# Patient Record
Sex: Female | Born: 1965 | Race: White | Hispanic: No | Marital: Married | Smoking: Never smoker
Health system: Southern US, Community
[De-identification: ages and names within clinical notes are randomized; demographics above are authoritative.]

## PROBLEM LIST (undated history)

## (undated) DIAGNOSIS — R011 Cardiac murmur, unspecified: Secondary | ICD-10-CM

## (undated) HISTORY — PX: COLONOSCOPY: SHX174

## (undated) HISTORY — DX: Cardiac murmur, unspecified: R01.1

## (undated) HISTORY — PX: LAPAROSCOPY: SHX197

## (undated) HISTORY — PX: ENDOMETRIAL ABLATION: SHX621

---

## 2000-03-09 DIAGNOSIS — D229 Melanocytic nevi, unspecified: Secondary | ICD-10-CM

## 2000-03-09 HISTORY — DX: Melanocytic nevi, unspecified: D22.9

## 2008-01-16 ENCOUNTER — Ambulatory Visit: Payer: Self-pay | Admitting: Internal Medicine

## 2008-02-01 ENCOUNTER — Ambulatory Visit: Payer: Self-pay | Admitting: Internal Medicine

## 2010-04-07 ENCOUNTER — Encounter: Admission: RE | Admit: 2010-04-07 | Discharge: 2010-04-07 | Payer: Self-pay | Admitting: Obstetrics and Gynecology

## 2014-04-10 ENCOUNTER — Encounter: Payer: Self-pay | Admitting: Internal Medicine

## 2018-02-08 ENCOUNTER — Encounter: Payer: Self-pay | Admitting: Internal Medicine

## 2018-03-12 ENCOUNTER — Encounter: Payer: Self-pay | Admitting: Internal Medicine

## 2018-04-10 ENCOUNTER — Encounter: Payer: Self-pay | Admitting: Internal Medicine

## 2018-04-10 ENCOUNTER — Ambulatory Visit (AMBULATORY_SURGERY_CENTER): Payer: Self-pay | Admitting: *Deleted

## 2018-04-10 ENCOUNTER — Other Ambulatory Visit: Payer: Self-pay

## 2018-04-10 VITALS — Ht 64.0 in | Wt 124.4 lb

## 2018-04-10 DIAGNOSIS — Z1211 Encounter for screening for malignant neoplasm of colon: Secondary | ICD-10-CM

## 2018-04-10 DIAGNOSIS — Z800 Family history of malignant neoplasm of digestive organs: Secondary | ICD-10-CM

## 2018-04-10 MED ORDER — SUPREP BOWEL PREP KIT 17.5-3.13-1.6 GM/177ML PO SOLN: 1.0000 | Freq: Once | ORAL | 0 refills | Status: AC

## 2018-04-10 NOTE — Progress Notes (Signed)
No egg or soy allergy known to patient  No issues with past sedation with any surgeries  or procedures, no intubation problems  No diet pills per patient No home 02 use per patient  No blood thinners per patient  Pt denies issues with constipation  No A fib or A flutter  EMMI video sent to pt's e mail  Suprep 15 dollar coupon given

## 2018-04-11 ENCOUNTER — Telehealth: Payer: Self-pay | Admitting: Internal Medicine

## 2018-04-11 DIAGNOSIS — Z1211 Encounter for screening for malignant neoplasm of colon: Secondary | ICD-10-CM

## 2018-04-11 MED ORDER — NA SULFATE-K SULFATE-MG SULF 17.5-3.13-1.6 GM/177ML PO SOLN: 1.0000 | Freq: Once | ORAL | 0 refills | Status: AC

## 2018-04-11 NOTE — Telephone Encounter (Signed)
Sent prep to [employer] [facility]

## 2018-04-24 ENCOUNTER — Encounter: Payer: Self-pay | Admitting: Internal Medicine

## 2018-04-24 ENCOUNTER — Ambulatory Visit (AMBULATORY_SURGERY_CENTER): Payer: PRIVATE HEALTH INSURANCE | Admitting: Internal Medicine

## 2018-04-24 VITALS — BP 118/70 | HR 67 | Temp 98.6°F | Resp 14 | Ht 64.0 in | Wt 124.0 lb

## 2018-04-24 DIAGNOSIS — Z1211 Encounter for screening for malignant neoplasm of colon: Secondary | ICD-10-CM

## 2018-04-24 MED ORDER — SODIUM CHLORIDE 0.9 % IV SOLN
500.0000 mL | Freq: Once | INTRAVENOUS | Status: DC
Start: 2018-04-24 — End: 2019-10-09

## 2018-04-24 NOTE — Op Note (Signed)
[place] Patient Name: Latoya Colon Procedure Date: 04/24/2018 2:00 PM MRN: [id number] Endoscopist: Docia Chuck. Henrene Pastor , MD Age: 52 Referring MD:  Date of Birth: 04/07/1966 Gender: Female Account #: [id number] Procedure:                Colonoscopy Indications:              Screening in patient at increased risk: Colorectal                            cancer in father 100 or older (89). Negative index                            exam 2009 Medicines:                Monitored Anesthesia Care Procedure:                Pre-Anesthesia Assessment:                           - Prior to the procedure, a History and Physical                            was performed, and patient medications and                            allergies were reviewed. The patient's tolerance of                            previous anesthesia was also reviewed. The risks                            and benefits of the procedure and the sedation                            options and risks were discussed with the patient.                            All questions were answered, and informed consent                            was obtained. Prior Anticoagulants: The patient has                            taken no previous anticoagulant or antiplatelet                            agents. ASA Grade Assessment: I - A normal, healthy                            patient. After reviewing the risks and benefits,                            the patient was deemed in satisfactory condition to  undergo the procedure.                           After obtaining informed consent, the colonoscope                            was passed under direct vision. Throughout the                            procedure, the patient's blood pressure, pulse, and                            oxygen saturations were monitored continuously. The                            Model GIF-HQ190 [id number]) scope was introduced                     through the anus and advanced to the the cecum,                            identified by appendiceal orifice and ileocecal                            valve. The ileocecal valve, appendiceal orifice,                            and rectum were photographed. The quality of the                            bowel preparation was good. The colonoscopy was                            performed without difficulty. The patient tolerated                            the procedure well. The bowel preparation used was                            SUPREP. Scope In: 2:06:46 PM Scope Out: 2:41:06 PM Scope Withdrawal Time: 0 hours 9 minutes 15 seconds  Total Procedure Duration: 0 hours 34 minutes 20 seconds  Findings:                 The entire examined colon appeared normal on direct                            and retroflexion views. Complications:            No immediate complications. Estimated blood loss:                            None. Estimated Blood Loss:     Estimated blood loss: none. Impression:               - The entire examined colon is normal on direct and  retroflexion views.                           - NOTE: The rectosigmoid reason was fixed/narrow                            not permitting easy passage of either the adult                            colonoscope or the pediatric colonoscope. The                            examination was achieved with the standard upper                            endoscope. Recommendation:           - Repeat colonoscopy in 10 years for screening                            purposes (STANDARD UPPER ENDOSCOPE LIKELY NEEDED).                           - Patient has a contact number available for                            emergencies. The signs and symptoms of potential                            delayed complications were discussed with the                            patient. Return to normal activities tomorrow.                             Written discharge instructions were provided to the                            patient.                           - Resume previous diet.                           - Continue present medications. Docia Chuck. Henrene Pastor, MD 04/24/2018 2:54:59 PM This report has been signed electronically.

## 2018-04-24 NOTE — Progress Notes (Signed)
PT taken to PACU. Monitors in place. VSS. Report given to RN. 

## 2018-04-24 NOTE — Patient Instructions (Signed)
YOU HAD AN ENDOSCOPIC PROCEDURE TODAY AT [facility]:   Refer to the procedure report that was given to you for any specific questions about what was found during the examination.  If the procedure report does not answer your questions, please call your gastroenterologist to clarify.  If you requested that your care partner not be given the details of your procedure findings, then the procedure report has been included in a sealed envelope for you to review at your convenience later.  YOU SHOULD EXPECT: Some feelings of bloating in the abdomen. Passage of more gas than usual.  Walking can help get rid of the air that was put into your GI tract during the procedure and reduce the bloating. If you had a lower endoscopy (such as a colonoscopy or flexible sigmoidoscopy) you may notice spotting of blood in your stool or on the toilet paper. If you underwent a bowel prep for your procedure, you may not have a normal bowel movement for a few days.  Please Note:  You might notice some irritation and congestion in your nose or some drainage.  This is from the oxygen used during your procedure.  There is no need for concern and it should clear up in a day or so.  SYMPTOMS TO REPORT IMMEDIATELY:   Following lower endoscopy (colonoscopy or flexible sigmoidoscopy):  Excessive amounts of blood in the stool  Significant tenderness or worsening of abdominal pains  Swelling of the abdomen that is new, acute  Fever of 100F or higher  For urgent or emergent issues, a gastroenterologist can be reached at any hour by calling [phone].   DIET:  We do recommend a small meal at first, but then you may proceed to your regular diet.  Drink plenty of fluids but you should avoid alcoholic beverages for 24 hours.  MEDICATION: Continue present medications.  ACTIVITY:  You should plan to take it easy for the rest of today and you should NOT DRIVE or use heavy machinery until tomorrow (because of the  sedation medicines used during the test).    FOLLOW UP: Our staff will call the number listed on your records the next business day following your procedure to check on you and address any questions or concerns that you may have regarding the information given to you following your procedure. If we do not reach you, we will leave a message.  However, if you are feeling well and you are not experiencing any problems, there is no need to return our call.  We will assume that you have returned to your regular daily activities without incident.  If any biopsies were taken you will be contacted by phone or by letter within the next 1-3 weeks.  Please call us at [phone] if you have not heard about the biopsies in 3 weeks.   Thank you for allowing [place] to provide for your healthcare needs today.   SIGNATURES/CONFIDENTIALITY: You and/or your care partner have signed paperwork which will be entered into your electronic medical record.  These signatures attest to the fact that that the information above on your After Visit Summary has been reviewed and is understood.  Full responsibility of the confidentiality of this discharge information lies with you and/or your care-partner.

## 2018-04-24 NOTE — Progress Notes (Signed)
Pt's states no medical or surgical changes since previsit or office visit. 

## 2018-04-25 ENCOUNTER — Telehealth: Payer: Self-pay

## 2018-04-25 ENCOUNTER — Telehealth: Payer: Self-pay | Admitting: *Deleted

## 2018-04-25 NOTE — Telephone Encounter (Signed)
  Follow up Call-  Call back number 04/24/2018  Post procedure Call Back phone  # [phone]  Permission to leave phone message Yes  Some recent data might be hidden     Patient questions:  Do you have a fever, pain , or abdominal swelling? No. Pain Score  0 *  Have you tolerated food without any problems? Yes.    Have you been able to return to your normal activities? Yes.    Do you have any questions about your discharge instructions: Diet   No. Medications  No. Follow up visit  No.  Do you have questions or concerns about your Care? No.  Actions: * If pain score is 4 or above: No action needed, pain <4.

## 2018-04-25 NOTE — Telephone Encounter (Signed)
No answer, left message to call if questions or concerns. 

## 2019-10-09 ENCOUNTER — Ambulatory Visit (INDEPENDENT_AMBULATORY_CARE_PROVIDER_SITE_OTHER): Payer: PRIVATE HEALTH INSURANCE | Admitting: Dermatology

## 2019-10-09 ENCOUNTER — Encounter: Payer: Self-pay | Admitting: Dermatology

## 2019-10-09 ENCOUNTER — Other Ambulatory Visit: Payer: Self-pay

## 2019-10-09 ENCOUNTER — Encounter (INDEPENDENT_AMBULATORY_CARE_PROVIDER_SITE_OTHER): Payer: Self-pay

## 2019-10-09 ENCOUNTER — Telehealth: Payer: Self-pay | Admitting: *Deleted

## 2019-10-09 DIAGNOSIS — L719 Rosacea, unspecified: Secondary | ICD-10-CM

## 2019-10-09 MED ORDER — SOOLANTRA 1 % EX CREA: 30.0000 | TOPICAL_CREAM | Freq: Every morning | CUTANEOUS | 2 refills | Status: DC

## 2019-10-09 MED ORDER — METRONIDAZOLE 1 % EX GEL: Freq: Every day | CUTANEOUS | 3 refills | Status: DC

## 2019-10-09 NOTE — Telephone Encounter (Signed)
Latoya Colon (Latoya Colon)  Your information has been sent to [facility].  Prior authorization done via cover my meds for Rx. Metronidazole 1% gel

## 2019-10-11 ENCOUNTER — Telehealth: Payer: Self-pay | Admitting: Dermatology

## 2019-10-11 MED ORDER — SOOLANTRA 1 % EX CREA: 1.0000 "application " | TOPICAL_CREAM | Freq: Every day | CUTANEOUS | 2 refills | Status: DC

## 2019-10-11 NOTE — Telephone Encounter (Addendum)
Patient calling to give follow up information on last visit.  Got Soolantra  Prescription for a copayment of $75.00. Having trouble getting prescription through Krist[facility] Oppenheim at [facility].  Patient instructs [place] to use [facility] in [place]. Phone number is [phone].  Chart # [id number].

## 2019-10-13 ENCOUNTER — Encounter: Payer: Self-pay | Admitting: Dermatology

## 2019-10-13 NOTE — Progress Notes (Signed)
   Follow-Up Visit   Subjective  Latoya Colon is a 54 y.o. female who presents for the following: Skin Problem (Patient here today for break out on left cheek and neck x months.  Patient states that she's used [place] did help some but the medication was expired.  Now patients face is flared up and red.).  rosacea Location: Face and neck Duration: Years Quality: Waxes and wanes Associated Signs/Symptoms: Red blotches Modifying Factors: Topical metronidazole and cilantro help Severity:  Timing: Context: Covid mask may worsen  The following portions of the chart were reviewed this encounter and updated as appropriate:     Objective  Well appearing patient in no apparent distress; mood and affect are within normal limits.  A focused examination was performed including head and neck examined. Relevant physical exam findings are noted in the Assessment and Plan.   Assessment & Plan  Rosacea (5) Head - Anterior (Face) (2); Left Eye; Neck - Anterior (2)  Patient expresses opinion that both Soolantra and topical metronidazole are helpful together. I'm okay with her alternating these topicals nightly, taper if doing well (? Post-COVID).

## 2019-10-24 ENCOUNTER — Other Ambulatory Visit: Payer: Self-pay

## 2019-10-24 MED ORDER — METRONIDAZOLE 1 % EX GEL: Freq: Every day | CUTANEOUS | 3 refills | Status: DC

## 2019-11-19 ENCOUNTER — Encounter: Payer: Self-pay | Admitting: *Deleted

## 2019-11-20 ENCOUNTER — Other Ambulatory Visit: Payer: Self-pay

## 2019-11-20 ENCOUNTER — Encounter: Payer: Self-pay | Admitting: Dermatology

## 2019-11-20 ENCOUNTER — Ambulatory Visit (INDEPENDENT_AMBULATORY_CARE_PROVIDER_SITE_OTHER): Payer: PRIVATE HEALTH INSURANCE | Admitting: Dermatology

## 2019-11-20 DIAGNOSIS — Z86018 Personal history of other benign neoplasm: Secondary | ICD-10-CM | POA: Diagnosis not present

## 2019-11-20 DIAGNOSIS — D225 Melanocytic nevi of trunk: Secondary | ICD-10-CM

## 2019-11-20 DIAGNOSIS — L821 Other seborrheic keratosis: Secondary | ICD-10-CM

## 2019-11-20 DIAGNOSIS — L309 Dermatitis, unspecified: Secondary | ICD-10-CM | POA: Diagnosis not present

## 2019-11-20 DIAGNOSIS — D229 Melanocytic nevi, unspecified: Secondary | ICD-10-CM

## 2019-11-20 NOTE — Progress Notes (Signed)
   Follow-Up Visit   Subjective  Latoya Colon is a 54 y.o. female who presents for the following: Annual Exam (no concerns).  Moles Location: All over Duration: Able Quality:  Associated Signs/Symptoms: Modifying Factors:  Severity:  Timing: Context: 3 of atypical moles  The following portions of the chart were reviewed this encounter and updated as appropriate: Tobacco  Allergies  Meds  Problems  Med Hx  Surg Hx  Fam Hx      Objective  Well appearing patient in no apparent distress; mood and affect are within normal limits.  A full examination was performed including scalp, head, eyes, ears, nose, lips, neck, chest, axillae, abdomen, back, buttocks, bilateral upper extremities, bilateral lower extremities, hands, feet, fingers, toes, fingernails, and toenails. All findings within normal limits unless otherwise noted below.   Assessment & Plan  Dermatitis (2) Head - Anterior (Face); Lips  We will consider adding Clocosteroin when it becomes available  Nevus Mid Back  Annual skin check Had 2 issues that Barnetta Chapel and I discussed today.  The first is ongoing issue with breaking out on her mid to lateral cheeks. Rx topical may be helping but she feels that outdoor exposure with moderate sunlight is actually more beneficial.  When Clocosterone becomes available, I will phone this and for Curt Bears without a visit.  The second issue is a history of atypical moles.  Feet to scalp examined today and there are no recurrent nor new atypical moles.  She continues to get multiple benign keratoses, most numerous on the back plus subtle flesh-colored keratoses on the right front rib cage and on the sides.  Routine recheck in 1 year.

## 2020-01-22 ENCOUNTER — Encounter: Payer: Self-pay | Admitting: Dermatology

## 2020-01-24 MED ORDER — WINLEVI 1 % EX CREA: TOPICAL_CREAM | CUTANEOUS | 5 refills | Status: DC

## 2020-01-24 NOTE — Telephone Encounter (Signed)
Latoya Colon has been sent to pharmacy.

## 2020-01-27 ENCOUNTER — Telehealth: Payer: Self-pay

## 2020-01-27 MED ORDER — WINLEVI 1 % EX CREA: 1.0000 "application " | TOPICAL_CREAM | Freq: Every day | CUTANEOUS | 1 refills | Status: DC

## 2020-01-27 NOTE — Telephone Encounter (Signed)
Patient aware Winlevi 1% cream not covered, patient wanted the medication transferred to new [employer].

## 2020-03-18 ENCOUNTER — Telehealth: Payer: Self-pay

## 2020-03-18 NOTE — Telephone Encounter (Signed)
NOTES ON FILE FROM Louretta Shorten MD [phone], SENT REFERRAL TO [facility]

## 2020-07-13 ENCOUNTER — Encounter: Payer: Self-pay | Admitting: Internal Medicine

## 2020-07-13 ENCOUNTER — Telehealth (INDEPENDENT_AMBULATORY_CARE_PROVIDER_SITE_OTHER): Payer: PRIVATE HEALTH INSURANCE | Admitting: Internal Medicine

## 2020-07-13 VITALS — Wt 129.0 lb

## 2020-07-13 DIAGNOSIS — Z7189 Other specified counseling: Secondary | ICD-10-CM | POA: Diagnosis not present

## 2020-07-13 DIAGNOSIS — E7800 Pure hypercholesterolemia, unspecified: Secondary | ICD-10-CM | POA: Diagnosis not present

## 2020-07-13 NOTE — Patient Instructions (Signed)
Medication Instructions:  No Changes In Medications at this time.  *If you need a refill on your cardiac medications before your next appointment, please call your pharmacy*  Lab Work: None Ordered At This Time.  If you have labs (blood work) drawn today and your tests are completely normal, you will receive your results only by: Marland Kitchen MyChart Message (if you have MyChart) OR . A paper copy in the mail If you have any lab test that is abnormal or we need to change your treatment, we will call you to review the results.  Testing/Procedures: Dr. Debara Pickett has ordered a CT coronary calcium score. This test is done at [address]. This is $99 out of pocket.  Coronary CalciumScan A coronary calcium scan is an imaging test used to look for deposits of calcium and other fatty materials (plaques) in the inner lining of the blood vessels of the heart (coronary arteries). These deposits of calcium and plaques can partly clog and narrow the coronary arteries without producing any symptoms or warning signs. This puts a person at risk for a heart attack. This test can detect these deposits before symptoms develop. Tell a health care provider about:  Any allergies you have.  All medicines you are taking, including vitamins, herbs, eye drops, creams, and over-the-counter medicines.  Any problems you or family members have had with anesthetic medicines.  Any blood disorders you have.  Any surgeries you have had.  Any medical conditions you have.  Whether you are pregnant or may be pregnant. What are the risks? Generally, this is a safe procedure. However, problems may occur, including:  Harm to a pregnant woman and her unborn baby. This test involves the use of radiation. Radiation exposure can be dangerous to a pregnant woman and her unborn baby. If you are pregnant, you generally should not have this procedure done.  Slight increase in the risk of cancer. This is because of the  radiation involved in the test. What happens before the procedure? No preparation is needed for this procedure. What happens during the procedure?  You will undress and remove any jewelry around your neck or chest.  You will put on a hospital gown.  Sticky electrodes will be placed on your chest. The electrodes will be connected to an electrocardiogram (ECG) machine to record a tracing of the electrical activity of your heart.  A CT scanner will take pictures of your heart. During this time, you will be asked to lie still and hold your breath for 2-3 seconds while a picture of your heart is being taken. The procedure may vary among health care providers and hospitals. What happens after the procedure?  You can get dressed.  You can return to your normal activities.  It is up to you to get the results of your test. Ask your health care provider, or the department that is doing the test, when your results will be ready. Summary  A coronary calcium scan is an imaging test used to look for deposits of calcium and other fatty materials (plaques) in the inner lining of the blood vessels of the heart (coronary arteries).  Generally, this is a safe procedure. Tell your health care provider if you are pregnant or may be pregnant.  No preparation is needed for this procedure.  A CT scanner will take pictures of your heart.  You can return to your normal activities after the scan is done. This information is not intended to replace advice  given to you by your health care provider. Make sure you discuss any questions you have with your health care provider. Document Released: 12/17/2007 Document Revised: 05/09/2016 Document Reviewed: 05/09/2016 Elsevier Interactive Patient Education  2017 [place]: At [facility], you and your health needs are our priority.  As part of our continuing mission to provide you with exceptional heart care, we have created designated Provider  Care Teams.  These Care Teams include your primary Cardiologist (physician) and Advanced Practice Providers (APPs -  Physician Assistants and Nurse Practitioners) who all work together to provide you with the care you need, when you need it.  Your next appointment:   3-4 month(s) LIPID   The format for your next appointment:   In Person  Provider:   K. Mali Hilty, MD

## 2020-07-13 NOTE — Progress Notes (Signed)
Virtual Visit via Telephone Note   This visit type was conducted due to national recommendations for restrictions regarding the COVID-19 Pandemic (e.g. social distancing) in an effort to limit this patient's exposure and mitigate transmission in our community.  Due to her co-morbid illnesses, this patient is at least at moderate risk for complications without adequate follow up.  This format is felt to be most appropriate for this patient at this time.  The patient did not have access to video technology/had technical difficulties with video requiring transitioning to audio format only (telephone).  All issues noted in this document were discussed and addressed.  No physical exam could be performed with this format.  Please refer to the patient's chart for her  consent to telehealth for [facility].   Date:  07/13/2020   ID:  Latoya Colon, DOB 1966-01-17, MRN [id number] The patient was identified using 2 identifiers.  Evaluation Performed:  New Patient Evaluation  Patient Location:  [address] [place] [address]  Provider location:   [address], [place], [place]  [address]  PCP:  Louretta Shorten, MD  Cardiologist:  No primary care provider on file. Electrophysiologist:  None   Chief Complaint:  Manage dyslipidemia  History of Present Illness:    Latoya Colon is a 55 y.o. female who presents via audio/video conferencing for a telehealth visit today.  This is a pleasant 55 year old female kindly referred by Dr. Corinna Capra, with a longstanding history of dyslipidemia.  She reports her father had dyslipidemia but lived into his 10s with no coronary disease and died of colon cancer.  There is a lot of cancer in her family but no early onset heart disease.  She is known about high cholesterol for a number of years.  She has basically tried to manage it with diet and exercise.  She maintains a normal weight and exercises regularly and does yoga, but more recently she has  been less active due to COVID-19 pandemic and does report higher than normal intake of saturated fats including cheeses, butter and oils.  She does not necessarily eat a lot of fried food.  After seeing her PCP/GYN she had started on some red yeast rice but is not consistent with it.  She also takes over-the-counter vitamins and minerals but no prescription medications.  Although she does follow-up with Dr. Corinna Capra, she generally tries to avoid the health system.  She had lab work in September which showed a high total cholesterol of 272, triglycerides of 106, HDL 57, and LDL of 194.  Her LDL in March 2020 was 190.  These findings suggest probable familial hyperlipidemia however there could be a significant dietary component.  I would base this on her family history of very high cholesterol in her father.  We discussed the current guidelines which recommend lipid-lowering therapy on a statin with 50% reduction.  She is however doubtful that she has any significant coronary disease despite her high cholesterol and says she is generally not worried about it.  We then discussed the possibility of evaluation of coronary disease and particularly calcium scoring for risk prediction.  The patient does not have symptoms concerning for COVID-19 infection (fever, chills, cough, or new SHORTNESS OF BREATH).    Prior CV studies:   The following studies were reviewed today:  Lab work  PMHx:  Past Medical History:  Diagnosis Date  . Atypical mole 03/09/2000   Left Mid Scapula-Slight to Moderate(Dr. Dorothe Pea) (Exc)  . Atypical mole 11/15/2001  Right Scapula-Moderate (Dr. Shela Commons)  . Atypical mole 07/07/2003   Mid Upper Back-Moderate (Dr. Shela Commons)  . Heart murmur     Past Surgical History:  Procedure Laterality Date  . COLONOSCOPY    . ENDOMETRIAL ABLATION    . LAPAROSCOPY      FAMHx:  Family History  Problem Relation Age of Onset  . Colon cancer Father   . Colon polyps Sister   . Colon polyps Brother    . Stomach cancer Paternal Grandfather   . Esophageal cancer Neg Hx   . Rectal cancer Neg Hx     SOCHx:   reports that she has never smoked. She has never used smokeless tobacco. She reports current alcohol use of about 7.0 standard drinks of alcohol per week. She reports that she does not use drugs.  ALLERGIES:  No Known Allergies  MEDS:  Current Meds  Medication Sig  . Calcium Carb-Cholecalciferol 500-400 MG-UNIT CHEW Chew by mouth.  . Glucosamine-Chondroit-Vit C-Mn (GLUCOSAMINE CHONDR 1500 COMPLX PO) Take by mouth.  . Multiple Vitamins-Minerals (MULTIVITAL PO) Take by mouth.  . Red Yeast Rice 600 MG CAPS Take by mouth.  . Turmeric (QC TUMERIC COMPLEX) 500 MG CAPS Take by mouth.     ROS: Pertinent items noted in HPI and remainder of comprehensive ROS otherwise negative.  Labs/Other Tests and Data Reviewed:    Recent Labs: No results found for requested labs within last 8760 hours.   Recent Lipid Panel No results found for: CHOL, TRIG, HDL, CHOLHDL, LDLCALC, LDLDIRECT  Wt Readings from Last 3 Encounters:  07/13/20 129 lb (58.5 kg)  04/24/18 124 lb (56.2 kg)  04/10/18 124 lb 6.4 oz (56.4 kg)     Exam:    Vital Signs:  Wt 129 lb (58.5 kg)   HC 64" (162.6 cm)   BMI 22.14 kg/m    Exam not performed due to telephone visit  ASSESSMENT & PLAN:    1. 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 Family history of high cholesterol in her father, no early onset heart disease  Ms. Heagle has a mixed dyslipidemia with high LDL.  There is a possibility of FH although there could be a dietary component.  Her weight is normal and she is active, she denies alcohol and has normal thyroid function.  This is most likely FH but may be a less pathogenic variant perhaps an APO B mutation.  There is no family history of early onset heart disease.  She did not seem particularly concerned about it but did agree to this consultation.  I think it would be interesting to understand whether  she has any evidence of early onset coronary disease and would recommend a CT calcium score to assess her arteries.  Certainly if there is extensive coronary calcification or high degree of age advanced coronary disease, then I would recommend aggressive therapy.  If there is no evidence of coronary calcification, I would still likely recommend therapy but we may not need to be quite as aggressive.  She seems agreeable to this.  I advised her to continue her red yeast rice for the time being as it is tolerated.  We will likely determine repeat lab work based on her calcium score.  Thanks as always for the kind referral.  Follow-up with me in about 3 to 4 months.  COVID-19 Education: The signs and symptoms of COVID-19 were discussed with the patient and how to seek care for testing (follow up with PCP or arrange E-visit).  The importance of social distancing was discussed today.  Patient Risk:   After full review of this patients clinical status, I feel that they are at least moderate risk at this time.  Time:   Today, I have spent 25 minutes with the patient with telehealth technology discussing dyslipidemia, family history of high cholesterol, risk factors for coronary artery disease, calcium scoring.     Medication Adjustments/Labs and Tests Ordered: Current medicines are reviewed at length with the patient today.  Concerns regarding medicines are outlined above.   Tests Ordered: No orders of the defined types were placed in this encounter.   Medication Changes: No orders of the defined types were placed in this encounter.   Disposition:  in 3 month(s)  Pixie Casino, MD, [facility], [place] Director of the Advanced Lipid Disorders &  Cardiovascular Risk Reduction Clinic Diplomate of the American Board of Clinical Lipidology Attending Cardiologist  Direct Dial: [phone]  Fax: [phone]  Website:  [email or web address]  Pixie Casino, MD   07/13/2020 8:18 AM

## 2020-07-21 ENCOUNTER — Inpatient Hospital Stay: Admission: RE | Admit: 2020-07-21 | Payer: PRIVATE HEALTH INSURANCE | Source: Ambulatory Visit

## 2020-07-27 ENCOUNTER — Other Ambulatory Visit: Payer: Self-pay

## 2020-07-27 ENCOUNTER — Ambulatory Visit (INDEPENDENT_AMBULATORY_CARE_PROVIDER_SITE_OTHER)
Admission: RE | Admit: 2020-07-27 | Discharge: 2020-07-27 | Disposition: A | Discharge status: Home / Self Care | Payer: Self-pay | Source: Ambulatory Visit | Attending: Internal Medicine | Admitting: Internal Medicine

## 2020-07-27 DIAGNOSIS — E7800 Pure hypercholesterolemia, unspecified: Secondary | ICD-10-CM

## 2020-07-27 DIAGNOSIS — Z7189 Other specified counseling: Secondary | ICD-10-CM

## 2020-10-02 ENCOUNTER — Telehealth: Payer: Self-pay | Admitting: Internal Medicine

## 2020-10-02 DIAGNOSIS — E7800 Pure hypercholesterolemia, unspecified: Secondary | ICD-10-CM

## 2020-10-02 NOTE — Telephone Encounter (Signed)
Lipid panel ordered in anticipation of 10/09/20 lipid clinic f/u -- was not ordered at last visit MyChart message reminder sent

## 2020-10-09 ENCOUNTER — Other Ambulatory Visit: Payer: Self-pay

## 2020-10-09 ENCOUNTER — Ambulatory Visit (INDEPENDENT_AMBULATORY_CARE_PROVIDER_SITE_OTHER): Payer: PRIVATE HEALTH INSURANCE | Admitting: Internal Medicine

## 2020-10-09 ENCOUNTER — Encounter: Payer: Self-pay | Admitting: Internal Medicine

## 2020-10-09 VITALS — BP 125/74 | HR 74 | Ht 64.0 in | Wt 124.0 lb

## 2020-10-09 DIAGNOSIS — E7800 Pure hypercholesterolemia, unspecified: Secondary | ICD-10-CM

## 2020-10-09 NOTE — Progress Notes (Signed)
LIPID CLINIC CONSULT NOTE  Chief Complaint:  Follow-up dyslipidemia  Primary Care Physician: Louretta Shorten, MD  Primary Cardiologist:  No primary care provider on file.  HPI:  Latoya Colon is a 55 y.o. female who is being seen today for the evaluation of dyslipidemia at the request of Louretta Shorten, MD. This is a pleasant 55 year old female kindly referred by Dr. Corinna Capra, with a longstanding history of dyslipidemia.  She reports her father had dyslipidemia but lived into his 41s with no coronary disease and died of colon cancer.  There is a lot of cancer in her family but no early onset heart disease.  She is known about high cholesterol for a number of years.  She has basically tried to manage it with diet and exercise.  She maintains a normal weight and exercises regularly and does yoga, but more recently she has been less active due to COVID-19 pandemic and does report higher than normal intake of saturated fats including cheeses, butter and oils.  She does not necessarily eat a lot of fried food.  After seeing her PCP/GYN she had started on some red yeast rice but is not consistent with it.  She also takes over-the-counter vitamins and minerals but no prescription medications.  Although she does follow-up with Dr. Corinna Capra, she generally tries to avoid the health system.  She had lab work in September which showed a high total cholesterol of 272, triglycerides of 106, HDL 57, and LDL of 194.  Her LDL in March 2020 was 190.  These findings suggest probable familial hyperlipidemia however there could be a significant dietary component.  I would base this on her family history of very high cholesterol in her father.  We discussed the current guidelines which recommend lipid-lowering therapy on a statin with 50% reduction.  She is however doubtful that she has any significant coronary disease despite her high cholesterol and says she is generally not worried about it.  We then discussed the possibility of  evaluation of coronary disease and particularly calcium scoring for risk prediction.  10/09/2020  Ms. Masri is seen today in follow-up.  She underwent calcium scoring which was 0 although she was noted to have some aortic atherosclerosis.  This is unusual at age 54.  She has been on red yeast rice which she seems to be tolerating.  She has not had her lipids rechecked since that time.  We discussed possible treatment options including dietary and lifestyle modifications and continue monitoring as she had no coronary calcium or because of her family history of early onset stroke and concerns that she would like to be more proactive.  She says she is really not interested in taking long-term medication however I think that I would recommend risk reduction with at least 50% LDL lowering.  This would best be accomplished with a prescription statin medication.  PMHx:  Past Medical History:  Diagnosis Date  . Atypical mole 03/09/2000   Left Mid Scapula-Slight to Moderate(Dr. Dorothe Pea) (Exc)  . Atypical mole 11/15/2001   Right Scapula-Moderate (Dr. Dorothe Pea)  . Atypical mole 07/07/2003   Mid Upper Back-Moderate (Dr. Dorothe Pea)  . Heart murmur     Past Surgical History:  Procedure Laterality Date  . COLONOSCOPY    . ENDOMETRIAL ABLATION    . LAPAROSCOPY      FAMHx:  Family History  Problem Relation Age of Onset  . Colon cancer Father   . Colon polyps Sister   . Colon polyps Brother   .  Stomach cancer Paternal Grandfather   . Esophageal cancer Neg Hx   . Rectal cancer Neg Hx     SOCHx:   reports that she has never smoked. She has never used smokeless tobacco. She reports current alcohol use of about 7.0 standard drinks of alcohol per week. She reports that she does not use drugs.  ALLERGIES:  No Known Allergies  ROS: Pertinent items noted in HPI and remainder of comprehensive ROS otherwise negative.  HOME MEDS: Current Outpatient Medications on File Prior to Visit  Medication Sig Dispense  Refill  . Calcium Carb-Cholecalciferol 500-400 MG-UNIT CHEW Chew by mouth.    . Glucosamine-Chondroit-Vit C-Mn (GLUCOSAMINE CHONDR 1500 COMPLX PO) Take by mouth.    . Multiple Vitamins-Minerals (MULTIVITAL PO) Take by mouth.    . Red Yeast Rice 600 MG CAPS Take by mouth.    . Turmeric 500 MG CAPS Take by mouth.     No current facility-administered medications on file prior to visit.    LABS/IMAGING: No results found for this or any previous visit (from the past 48 hour(s)). No results found.  LIPID PANEL: No results found for: CHOL, TRIG, HDL, CHOLHDL, VLDL, LDLCALC, LDLDIRECT  WEIGHTS: Wt Readings from Last 3 Encounters:  10/09/20 124 lb (56.2 kg)  07/13/20 129 lb (58.5 kg)  04/24/18 124 lb (56.2 kg)    VITALS: BP 125/74 (BP Location: Left Arm, Patient Position: Sitting)   Pulse 74   Ht 5\' 4"  (1.626 m)   Wt 124 lb (56.2 kg)   SpO2 98%   BMI 21.28 kg/m   EXAM: Deferred  EKG: Deferred  ASSESSMENT: 1. 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 Family history of high cholesterol in her father, no early onset heart disease 3. Zero (O) CAC score (07/2020) - aortic atherosclerosis  PLAN: 1.   Ms. Tregoning has a persistent dyslipidemia however has not had her lipids reassessed since starting on red yeast rice.  I like to recheck that with lipid profile and check an LP(a) as well.  There is family history of stroke and high cholesterol in her father but no early onset heart disease.  She had a 0 calcium score which is reassuring however was noted to have aortic atherosclerosis which is unusual and I would recommend statin therapy for her.  We will contact her with her lipid results and make a plan regarding therapy going forward.  Repeat lipids in 3 to 4 months after treatment.  Pixie Casino, MD, [facility], [place] Director of the Advanced Lipid Disorders &  Cardiovascular Risk Reduction Clinic Diplomate of the American Board of Clinical  Lipidology Attending Cardiologist  Direct Dial: [phone]  Fax: [phone]  Website:  [email or web address] Kalden Wanke 10/09/2020, 9:11 AM

## 2020-10-09 NOTE — Patient Instructions (Signed)
Medication Instructions:  No changes  *If you need a refill on your cardiac medications before your next appointment, please call your pharmacy*   Lab Work: NMR, LP(a)  If you have labs (blood work) drawn today and your tests are completely normal, you will receive your results only by: [place] MyChart Message (if you have MyChart) OR . A paper copy in the mail If you have any lab test that is abnormal or we need to change your treatment, we will call you to review the results.   Testing/Procedures: None ordered.   Follow-Up: At [facility], you and your health needs are our priority.  As part of our continuing mission to provide you with exceptional heart care, we have created designated Provider Care Teams.  These Care Teams include your primary Cardiologist (physician) and Advanced Practice Providers (APPs -  Physician Assistants and Nurse Practitioners) who all work together to provide you with the care you need, when you need it.  We recommend signing up for the patient portal called "MyChart".  Sign up information is provided on this After Visit Summary.  MyChart is used to connect with patients for Virtual Visits (Telemedicine).  Patients are able to view lab/test results, encounter notes, upcoming appointments, etc.  Non-urgent messages can be sent to your provider as well.   To learn more about what you can do with MyChart, go to [email or web address].    Your next appointment:   3 month(s) - LIPID CLINIC  The format for your next appointment:   In Person  Provider:   Raliegh Ip Mali Hilty, MD

## 2020-10-10 LAB — NMR, LIPOPROFILE
Cholesterol, Total: 273 mg/dL — ABNORMAL HIGH (ref 100–199)
HDL Particle Number: 38.7 umol/L (ref >=30.5)
HDL-C: 58 mg/dL (ref >39)
LDL Particle Number: 2042 nmol/L — ABNORMAL HIGH (ref <1000)
LDL Size: 21.1 nm (ref >20.5)
LDL-C (NIH Calc): 185 mg/dL — ABNORMAL HIGH (ref 0–99)
LP-IR Score: 54 — ABNORMAL HIGH (ref <=45)
Small LDL Particle Number: 1036 nmol/L — ABNORMAL HIGH (ref <=527)
Triglycerides: 165 mg/dL — ABNORMAL HIGH (ref 0–149)

## 2020-10-10 LAB — LIPOPROTEIN A (LPA): Lipoprotein (a): 178.4 nmol/L — ABNORMAL HIGH (ref <75.0)

## 2020-10-12 ENCOUNTER — Encounter (HOSPITAL_BASED_OUTPATIENT_CLINIC_OR_DEPARTMENT_OTHER): Payer: Self-pay

## 2020-10-12 ENCOUNTER — Other Ambulatory Visit: Payer: Self-pay | Admitting: *Deleted

## 2020-10-12 DIAGNOSIS — E7800 Pure hypercholesterolemia, unspecified: Secondary | ICD-10-CM

## 2020-10-14 ENCOUNTER — Telehealth: Payer: Self-pay | Admitting: Internal Medicine

## 2020-10-14 MED ORDER — ROSUVASTATIN CALCIUM 20 MG PO TABS: 20.0000 mg | ORAL_TABLET | Freq: Every day | ORAL | 2 refills | Status: DC

## 2020-10-14 NOTE — Telephone Encounter (Signed)
rx sent to pharmacy  See result note

## 2020-10-14 NOTE — Telephone Encounter (Signed)
Crestor refilled by [facility] RN per lab results - was waiting on patient to advise what pharmacy.

## 2020-10-14 NOTE — Telephone Encounter (Signed)
*  STAT* If patient is at the pharmacy, call can be transferred to refill team.   1. Which medications need to be refilled? (please list name of each medication and dose if known)  Rosuvastatin   2. Which pharmacy/location (including street and city if local pharmacy) is medication to be sent to? [facility] [address] [place]  3. Do they need a 30 day or 90 day supply? 30  Patient states Dr. Debara Pickett recommended this medication at her last appointment. The patient called her pharmacy and there was nothing ready to pick up yet

## 2021-01-01 ENCOUNTER — Other Ambulatory Visit: Payer: Self-pay | Admitting: *Deleted

## 2021-01-01 DIAGNOSIS — E7800 Pure hypercholesterolemia, unspecified: Secondary | ICD-10-CM

## 2021-01-08 LAB — NMR, LIPOPROFILE
Cholesterol, Total: 215 mg/dL — ABNORMAL HIGH (ref 100–199)
HDL Particle Number: 45.9 umol/L (ref >=30.5)
HDL-C: 62 mg/dL (ref >39)
LDL Particle Number: 1455 nmol/L — ABNORMAL HIGH (ref <1000)
LDL Size: 21.1 nm (ref >20.5)
LDL-C (NIH Calc): 126 mg/dL — ABNORMAL HIGH (ref 0–99)
LP-IR Score: 57 — ABNORMAL HIGH (ref <=45)
Small LDL Particle Number: 633 nmol/L — ABNORMAL HIGH (ref <=527)
Triglycerides: 152 mg/dL — ABNORMAL HIGH (ref 0–149)

## 2021-01-21 ENCOUNTER — Ambulatory Visit (INDEPENDENT_AMBULATORY_CARE_PROVIDER_SITE_OTHER): Payer: PRIVATE HEALTH INSURANCE | Admitting: Internal Medicine

## 2021-01-21 ENCOUNTER — Telehealth: Payer: Self-pay | Admitting: Internal Medicine

## 2021-01-21 ENCOUNTER — Other Ambulatory Visit: Payer: Self-pay

## 2021-01-21 ENCOUNTER — Encounter (HOSPITAL_BASED_OUTPATIENT_CLINIC_OR_DEPARTMENT_OTHER): Payer: Self-pay

## 2021-01-21 ENCOUNTER — Encounter (HOSPITAL_BASED_OUTPATIENT_CLINIC_OR_DEPARTMENT_OTHER): Payer: Self-pay | Admitting: Internal Medicine

## 2021-01-21 VITALS — BP 114/70 | HR 79 | Ht 64.0 in | Wt 123.8 lb

## 2021-01-21 DIAGNOSIS — E7800 Pure hypercholesterolemia, unspecified: Secondary | ICD-10-CM | POA: Diagnosis not present

## 2021-01-21 DIAGNOSIS — I700 Atherosclerosis of aorta: Secondary | ICD-10-CM

## 2021-01-21 MED ORDER — ROSUVASTATIN CALCIUM 20 MG PO TABS: 20.0000 mg | ORAL_TABLET | Freq: Every day | ORAL | 1 refills | Status: DC

## 2021-01-21 NOTE — Telephone Encounter (Signed)
PT states that she requested for Jenna to send [facility], but after checking the price would like to keep rx at [facility].

## 2021-01-21 NOTE — Patient Instructions (Signed)
Medication Instructions:  Your physician recommends that you continue on your current medications as directed. Please refer to the Current Medication list given to you today.  *If you need a refill on your cardiac medications before your next appointment, please call your pharmacy*   Lab Work: FASTING lab work in 12 months to check cholesterol  -- complete about 1 week before your next visit  If you have labs (blood work) drawn today and your tests are completely normal, you will receive your results only by: [place] (if you have MyChart) OR A paper copy in the mail If you have any lab test that is abnormal or we need to change your treatment, we will call you to review the results.   Testing/Procedures: NONE   Follow-Up: At [facility], you and your health needs are our priority.  As part of our continuing mission to provide you with exceptional heart care, we have created designated Provider Care Teams.  These Care Teams include your primary Cardiologist (physician) and Advanced Practice Providers (APPs -  Physician Assistants and Nurse Practitioners) who all work together to provide you with the care you need, when you need it.  We recommend signing up for the patient portal called "MyChart".  Sign up information is provided on this After Visit Summary.  MyChart is used to connect with patients for Virtual Visits (Telemedicine).  Patients are able to view lab/test results, encounter notes, upcoming appointments, etc.  Non-urgent messages can be sent to your provider as well.   To learn more about what you can do with MyChart, go to [email or web address].    Your next appointment:   12 month(s) - lipid clinic  The format for your next appointment:   In Person  Provider:   K. Mali Hilty, MD   Other Instructions

## 2021-01-21 NOTE — Progress Notes (Signed)
LIPID CLINIC CONSULT NOTE  Chief Complaint:  Follow-up dyslipidemia  Primary Care Physician: Louretta Shorten, MD  Primary Cardiologist:  None  HPI:  Latoya Colon is a 55 y.o. female who is being seen today for the evaluation of dyslipidemia at the request of Louretta Shorten, MD. This is a pleasant 55 year old female kindly referred by Dr. Corinna Capra, with a longstanding history of dyslipidemia.  She reports her father had dyslipidemia but lived into his 31s with no coronary disease and died of colon cancer.  There is a lot of cancer in her family but no early onset heart disease.  She is known about high cholesterol for a number of years.  She has basically tried to manage it with diet and exercise.  She maintains a normal weight and exercises regularly and does yoga, but more recently she has been less active due to COVID-19 pandemic and does report higher than normal intake of saturated fats including cheeses, butter and oils.  She does not necessarily eat a lot of fried food.  After seeing her PCP/GYN she had started on some red yeast rice but is not consistent with it.  She also takes over-the-counter vitamins and minerals but no prescription medications.  Although she does follow-up with Dr. Corinna Capra, she generally tries to avoid the health system.  She had lab work in September which showed a high total cholesterol of 272, triglycerides of 106, HDL 57, and LDL of 194.  Her LDL in March 2020 was 190.  These findings suggest probable familial hyperlipidemia however there could be a significant dietary component.  I would base this on her family history of very high cholesterol in her father.  We discussed the current guidelines which recommend lipid-lowering therapy on a statin with 50% reduction.  She is however doubtful that she has any significant coronary disease despite her high cholesterol and says she is generally not worried about it.  We then discussed the possibility of evaluation of coronary disease  and particularly calcium scoring for risk prediction.  10/09/2020  [name] is seen today in follow-up.  She underwent calcium scoring which was 0 although she was noted to have some aortic atherosclerosis.  This is unusual at age 68.  She has been on red yeast rice which she seems to be tolerating.  She has not had her lipids rechecked since that time.  We discussed possible treatment options including dietary and lifestyle modifications and continue monitoring as she had no coronary calcium or because of her family history of early onset stroke and concerns that she would like to be more proactive.  She says she is really not interested in taking long-term medication however I think that I would recommend risk reduction with at least 50% LDL lowering.  This would best be accomplished with a prescription statin medication.  01/21/2021  [name] returns today for follow-up.  Overall she is doing well on rosuvastatin.  She seems to tolerate it without any side effects.  She is on a 20 mg dose.  She has had marked reduction in her lipids with a LDL-P of 2042 down down to 1455, LDL-C of 185 down to 126, total cholesterol 273 down to 215 and small LDL-P of 633 (from 1036).  Overall she continues to make dietary changes and continue with exercise.  PMHx:  Past Medical History:  Diagnosis Date   Atypical mole 03/09/2000   Left Mid Scapula-Slight to Moderate(Dr. Dorothe Pea) (Exc)   Atypical mole 11/15/2001  Right Scapula-Moderate (Dr. Dorothe Pea)   Atypical mole 07/07/2003   Mid Upper Back-Moderate (Dr. Dorothe Pea)   Heart murmur     Past Surgical History:  Procedure Laterality Date   COLONOSCOPY     ENDOMETRIAL ABLATION     LAPAROSCOPY      FAMHx:  Family History  Problem Relation Age of Onset   Colon cancer Father    Colon polyps Sister    Colon polyps Brother    Stomach cancer Paternal Grandfather    Esophageal cancer Neg Hx    Rectal cancer Neg Hx     SOCHx:   reports that she has never  smoked. She has never used smokeless tobacco. She reports current alcohol use of about 7.0 standard drinks of alcohol per week. She reports that she does not use drugs.  ALLERGIES:  No Known Allergies  ROS: Pertinent items noted in HPI and remainder of comprehensive ROS otherwise negative.  HOME MEDS: Current Outpatient Medications on File Prior to Visit  Medication Sig Dispense Refill   Calcium Carb-Cholecalciferol 500-400 MG-UNIT CHEW Chew by mouth.     Glucosamine-Chondroit-Vit C-Mn (GLUCOSAMINE CHONDR 1500 COMPLX PO) Take by mouth.     Multiple Vitamins-Minerals (MULTIVITAL PO) Take by mouth.     Red Yeast Rice 600 MG CAPS Take by mouth.     rosuvastatin (CRESTOR) 20 MG tablet Take 1 tablet (20 mg total) by mouth daily. 30 tablet 2   Turmeric 500 MG CAPS Take by mouth.     No current facility-administered medications on file prior to visit.    LABS/IMAGING: No results found for this or any previous visit (from the past 48 hour(s)). No results found.  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   Ms. Brau has had significant improvement in her lipids.  I would try to target her LDL to be about 100 but this point she seems to be doing well on her medications.  We talked about additional dietary changes and exercise it might help her reach that goal.  Otherwise we could consider adding ezetimibe to the rosuvastatin to reach target.  Fortunately she  had no coronary calcium.  Would recommend reassessing this probably in about 7 to 10 years.  Follow-up with me in 1 year or sooner as necessary with repeat lipids.  Pixie Casino, MD, [facility], [place] Director of the [facility] Diplomate of the American Board of Clinical Lipidology Attending Cardiologist  Direct Dial: [phone]  Fax: [phone]  Website:  [email or web address]  Nadean Corwin Shann Merrick 01/21/2021, 8:11 AM

## 2021-01-25 ENCOUNTER — Ambulatory Visit (INDEPENDENT_AMBULATORY_CARE_PROVIDER_SITE_OTHER): Payer: PRIVATE HEALTH INSURANCE | Admitting: Dermatology

## 2021-01-25 ENCOUNTER — Encounter: Payer: Self-pay | Admitting: Dermatology

## 2021-01-25 ENCOUNTER — Other Ambulatory Visit: Payer: Self-pay

## 2021-01-25 DIAGNOSIS — L821 Other seborrheic keratosis: Secondary | ICD-10-CM

## 2021-01-25 DIAGNOSIS — D225 Melanocytic nevi of trunk: Secondary | ICD-10-CM

## 2021-01-25 DIAGNOSIS — Z1283 Encounter for screening for malignant neoplasm of skin: Secondary | ICD-10-CM

## 2021-01-25 DIAGNOSIS — Z86018 Personal history of other benign neoplasm: Secondary | ICD-10-CM

## 2021-01-25 DIAGNOSIS — D485 Neoplasm of uncertain behavior of skin: Secondary | ICD-10-CM

## 2021-01-25 DIAGNOSIS — B078 Other viral warts: Secondary | ICD-10-CM

## 2021-01-25 NOTE — Patient Instructions (Signed)

## 2021-01-30 ENCOUNTER — Encounter: Payer: Self-pay | Admitting: Dermatology

## 2021-01-30 NOTE — Progress Notes (Signed)
Follow-Up Visit   Subjective  Latoya Colon is a 55 y.o. female who presents for the following: Annual Exam (Patient here today for yearly skin check. Per patient check lesion left outer knee x few months no bleeding, no pain. Check lesion on patient's left lower back x unsure per patient larger,  no bleeding. Personal history of atypical moles. No personal history of melanoma or non mole skin cancer. Per patient family history of non mole skin cancer.).  Annual skin check, several spots to check including enlarging bump on left leg and an irritated mole under right arm. Location:  Duration:  Quality:  Associated Signs/Symptoms: Modifying Factors:  Severity:  Timing: Context:   Objective  Well appearing patient in no apparent distress; mood and affect are within normal limits. Head to toe Head to toe exam today.  By chromic papule on the right arm and warty papule on leg will be biopsied today.  Annual skin examination.  Left Abdomen (side) - Lower, Left Lower Back Brown, textured 5 mm flattopped papules.  Left Outer Knee Verrucous 5 mm pink crust       Right Axilla Bi-chromic slightly raised papule; dermoscopy equals melanocytic mixed reticular plus globular pattern.         A full examination was performed including scalp, head, eyes, ears, nose, lips, neck, chest, axillae, abdomen, back, buttocks, bilateral upper extremities, bilateral lower extremities, hands, feet, fingers, toes, fingernails, and toenails. All findings within normal limits unless otherwise noted below.  Areas beneath undergarments not fully examined   Assessment & Plan    Skin exam for malignant neoplasm Head to toe  Yearly skin check  Seborrheic keratosis (2) Left Abdomen (side) - Lower; Left Lower Back  Benign okay to leave unless patient wants removed.   Neoplasm of uncertain behavior of skin (2) Left Outer Knee  Skin / nail biopsy Type of biopsy: tangential   Informed  consent: discussed and consent obtained   Timeout: patient name, date of birth, surgical site, and procedure verified   Procedure prep:  Patient was prepped and draped in usual sterile fashion (Non sterile) Prep type:  Chlorhexidine Anesthesia: the lesion was anesthetized in a standard fashion   Anesthetic:  1% lidocaine w/ epinephrine 1-100,000 local infiltration Instrument used: flexible razor blade   Hemostasis achieved with: ferric subsulfate   Outcome: patient tolerated procedure well   Post-procedure details: sterile dressing applied and wound care instructions given   Dressing type: bandage and petrolatum    Destruction of lesion Complexity: simple   Destruction method: electrodesiccation and curettage   Informed consent: discussed and consent obtained   Timeout:  patient name, date of birth, surgical site, and procedure verified Anesthesia: the lesion was anesthetized in a standard fashion   Anesthetic:  1% lidocaine w/ epinephrine 1-100,000 local infiltration Curettage performed in three different directions: Yes   Electrodesiccation performed over the curetted area: Yes   Curettage cycles:  1 Margin per side (cm):  0.1 Final wound size (cm):  0.5 Hemostasis achieved with:  ferric subsulfate and electrodesiccation Outcome: patient tolerated procedure well with no complications   Post-procedure details: sterile dressing applied and wound care instructions given   Dressing type: bandage and petrolatum    Specimen 1 - Surgical pathology Differential Diagnosis: R/O Wart - treatment after biopsy  Check Margins: No  Right Axilla  Skin / nail biopsy Type of biopsy: tangential   Informed consent: discussed and consent obtained   Timeout: patient name, date of birth, surgical  site, and procedure verified   Procedure prep:  Patient was prepped and draped in usual sterile fashion (Non sterile) Prep type:  Chlorhexidine Anesthesia: the lesion was anesthetized in a standard  fashion   Anesthetic:  1% lidocaine w/ epinephrine 1-100,000 local infiltration Instrument used: flexible razor blade   Hemostasis achieved with: ferric subsulfate and electrodesiccation   Outcome: patient tolerated procedure well   Post-procedure details: sterile dressing applied and wound care instructions given   Dressing type: bandage and petrolatum    Specimen 2 - Surgical pathology Differential Diagnosis: R/O Tag - cautery  Check Margins: No      I, Lavonna Monarch, MD, have reviewed all documentation for this visit.  The documentation on 01/30/21 for the exam, diagnosis, procedures, and orders are all accurate and complete.

## 2021-02-26 IMAGING — CT CT CARDIAC CORONARY ARTERY CALCIUM SCORE
3 series · 14 of 20 positions shown, 15 images · non-contrast
Comparison: None
COMPARISON: None

Addendum:
EXAM:
OVER-READ INTERPRETATION  CT CHEST

The following report is an over-read performed by radiologist Dr.
over-read does not include interpretation of cardiac or coronary
anatomy or pathology. The coronary calcium score interpretation by
the cardiologist is attached.
CLINICAL DATA: Risk stratification
Coronary Calcium Score
TECHNIQUE: The patient was scanned on a Siemens Force scanner. Axial
non-contrast 3 mm slices were carried out through the heart. The
data set was analyzed on a dedicated work station and scored using
the Agatson method.

[Series 2: casc 3.0 bv41 2 bestdiast 65 % · axial · 0.33mm/px · z∈[-236,-155]mm · 4 of 45 slices shown, 5 images]
[im 9/45  vessel]
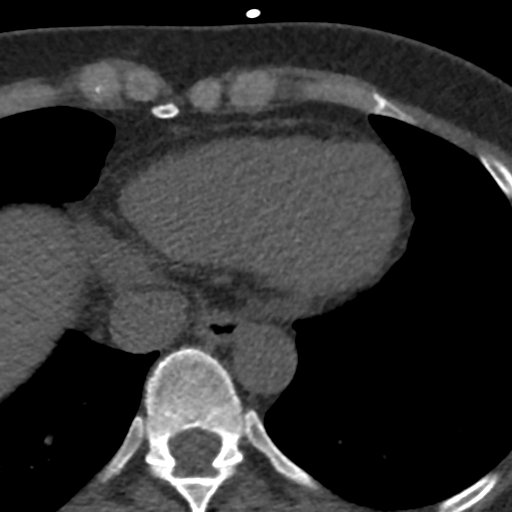
[im 9/45  lung]
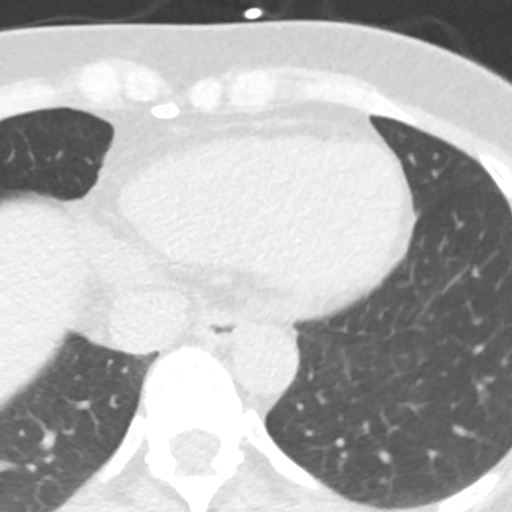
[im 18/45  vessel]
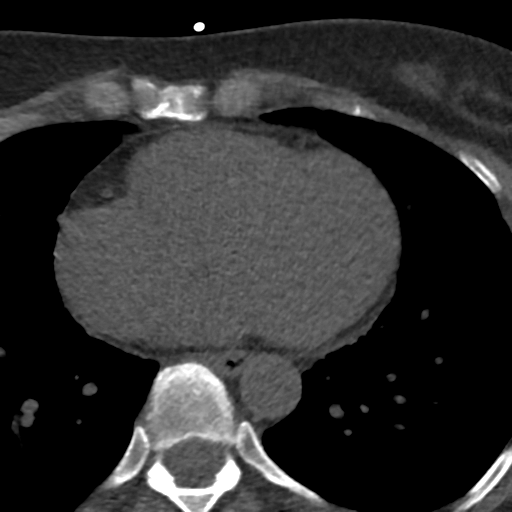
[im 27/45  vessel]
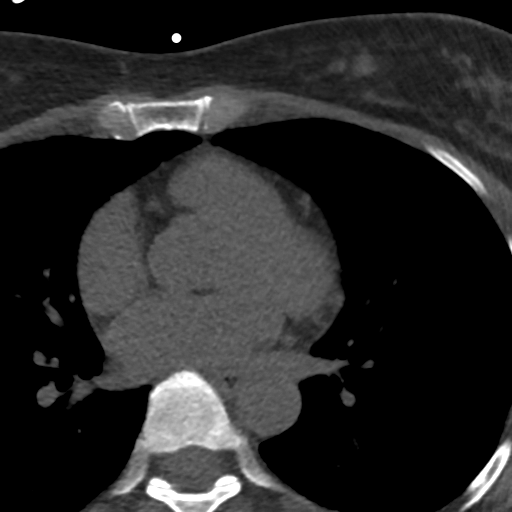
[im 36/45  vessel]
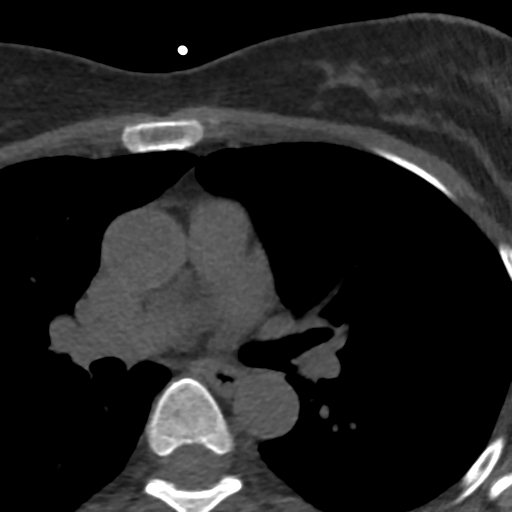

[Series 3: lung 69 % · axial · 0.63mm/px · z∈[-238,-152]mm · 5 of 45 slices shown]
[im 8/45  lung]
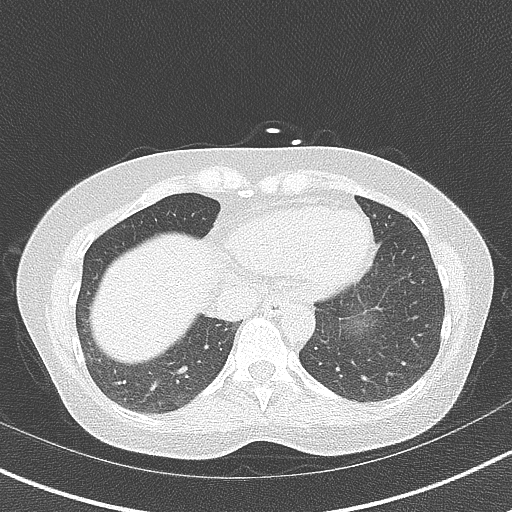
[im 15/45  lung]
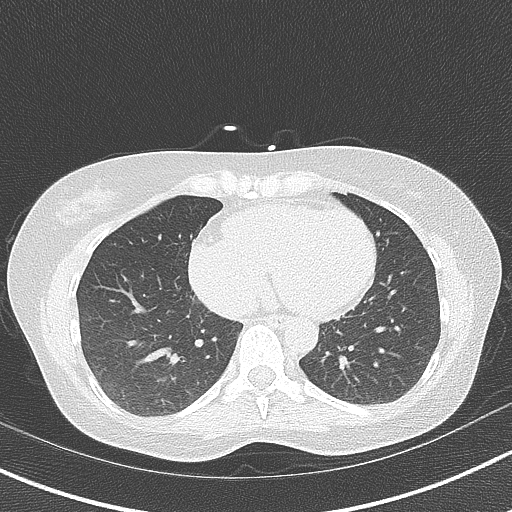
[im 23/45  lung]
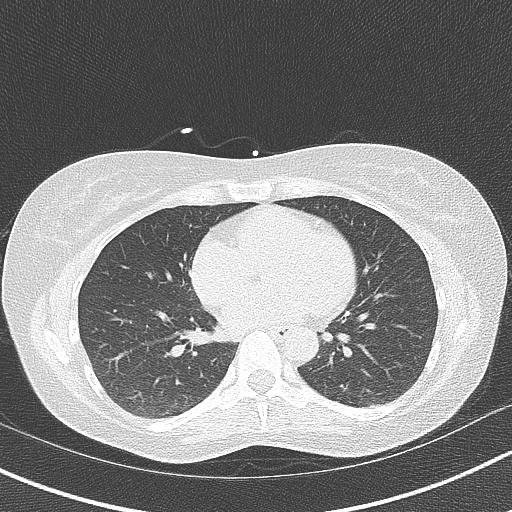
[im 30/45  lung]
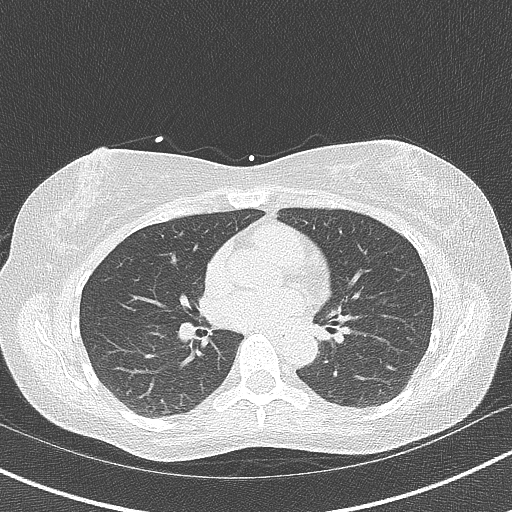
[im 37/45  lung]
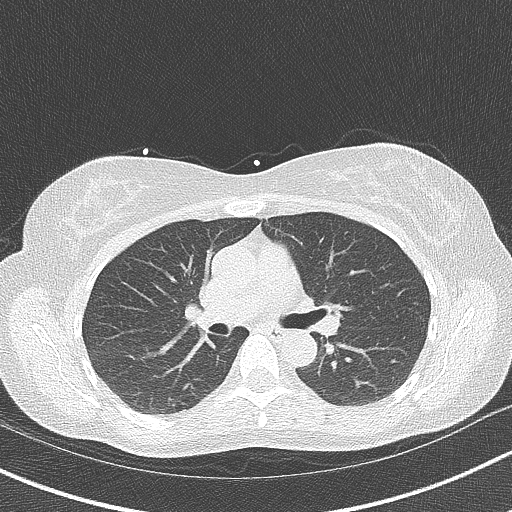

[Series 4: lung st 69 % · axial · 0.63mm/px · z∈[-238,-152]mm · 5 of 45 slices shown]
[im 8/45  lung]
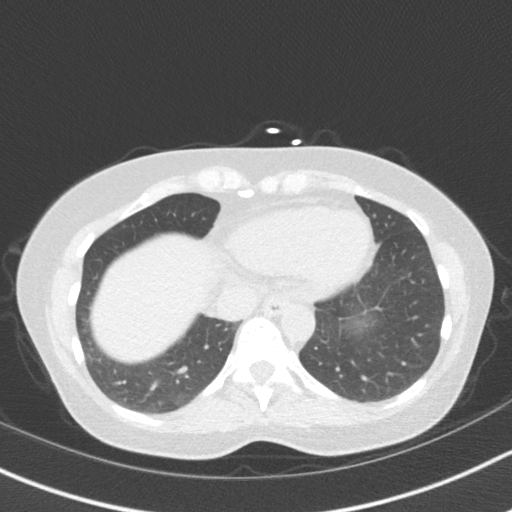
[im 15/45  lung]
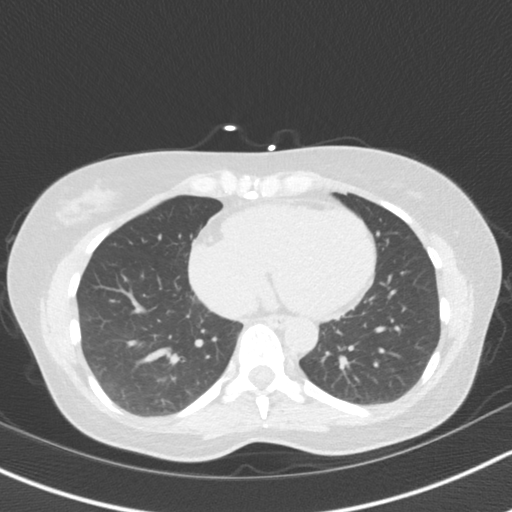
[im 23/45  lung]
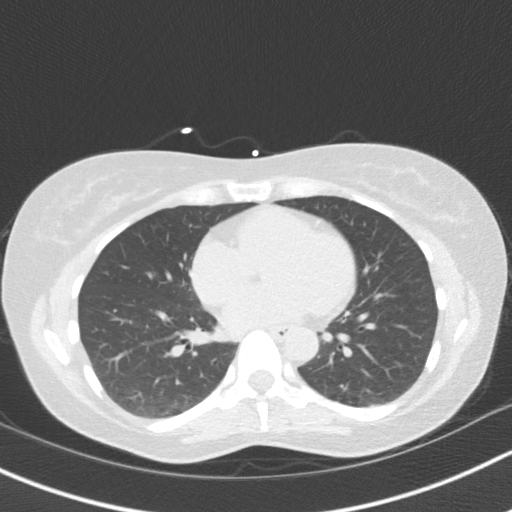
[im 30/45  lung]
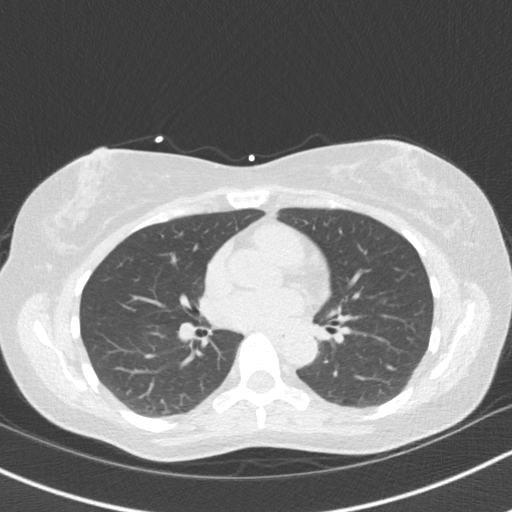
[im 37/45  lung]
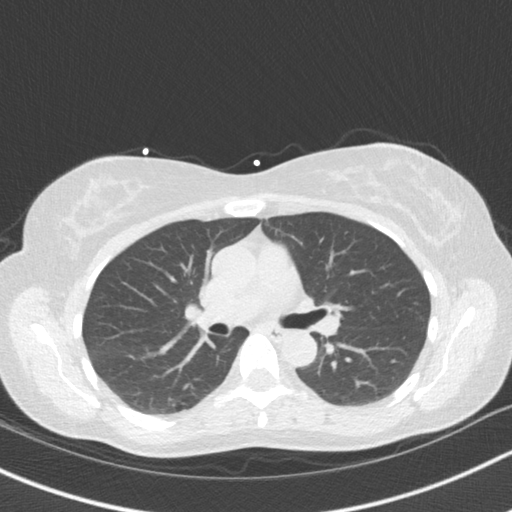

[14 of 20 positions shown; findings below may reference images not displayed]

FINDINGS: Vascular: Please see dedicated coronary calcium score for cardiac
findings. Heart size normal without pericardial effusion. Aortic
caliber is normal. There is evidence of aortic atherosclerosis in
the partially imaged aortic arch.

Mediastinum/Nodes: No signs of adenopathy in the imaged portions of
the mediastinum.

Lungs/Pleura: No effusion. No consolidative changes. Airways are
patent. Chest is incompletely imaged.

Upper Abdomen: Incidental imaging of upper abdominal contents is
unremarkable on very limited assessment.

Musculoskeletal: No chest wall mass or suspicious bone lesions
identified.
IMPRESSION: Aortic atherosclerosis.

Aortic Atherosclerosis (FXNAF-0DB.B).
FINDINGS: Non-cardiac: See separate report from [REDACTED].

Ascending Aorta: Normal size, measuring 28 mm at the mid ascending
aorta, measured double oblique at PA bifurcation. One area of
calcification noted in imaged segment of aortic arch, consistent
with aortic atherosclerosis.

Pericardium: Normal

Coronary arteries: Arise from normal coronary cusps.
IMPRESSION: Coronary calcium score of 0. This was 0th percentile for age and sex
matched control.

Small amount of aortic atherosclerosis in imaged portion of aortic
arch.

[employer]

*** End of Addendum ***
EXAM:
OVER-READ INTERPRETATION  CT CHEST

The following report is an over-read performed by radiologist Dr.
over-read does not include interpretation of cardiac or coronary
anatomy or pathology. The coronary calcium score interpretation by
the cardiologist is attached.
FINDINGS: Vascular: Please see dedicated coronary calcium score for cardiac
findings. Heart size normal without pericardial effusion. Aortic
caliber is normal. There is evidence of aortic atherosclerosis in
the partially imaged aortic arch.

Mediastinum/Nodes: No signs of adenopathy in the imaged portions of
the mediastinum.

Lungs/Pleura: No effusion. No consolidative changes. Airways are
patent. Chest is incompletely imaged.

Upper Abdomen: Incidental imaging of upper abdominal contents is
unremarkable on very limited assessment.

Musculoskeletal: No chest wall mass or suspicious bone lesions
identified.
IMPRESSION: Aortic atherosclerosis.

Aortic Atherosclerosis (FXNAF-0DB.B).

## 2022-01-25 ENCOUNTER — Ambulatory Visit (INDEPENDENT_AMBULATORY_CARE_PROVIDER_SITE_OTHER): Payer: PRIVATE HEALTH INSURANCE | Admitting: Dermatology

## 2022-01-25 ENCOUNTER — Encounter: Payer: Self-pay | Admitting: Dermatology

## 2022-01-25 DIAGNOSIS — Z1283 Encounter for screening for malignant neoplasm of skin: Secondary | ICD-10-CM | POA: Diagnosis not present

## 2022-01-25 DIAGNOSIS — L570 Actinic keratosis: Secondary | ICD-10-CM

## 2022-01-25 DIAGNOSIS — L821 Other seborrheic keratosis: Secondary | ICD-10-CM | POA: Diagnosis not present

## 2022-01-25 DIAGNOSIS — Z86018 Personal history of other benign neoplasm: Secondary | ICD-10-CM

## 2022-01-25 DIAGNOSIS — L918 Other hypertrophic disorders of the skin: Secondary | ICD-10-CM

## 2022-01-25 DIAGNOSIS — Z840 Family history of diseases of the skin and subcutaneous tissue: Secondary | ICD-10-CM

## 2022-01-25 DIAGNOSIS — Z808 Family history of malignant neoplasm of other organs or systems: Secondary | ICD-10-CM

## 2022-01-25 DIAGNOSIS — D1801 Hemangioma of skin and subcutaneous tissue: Secondary | ICD-10-CM

## 2022-02-16 ENCOUNTER — Encounter: Payer: Self-pay | Admitting: Dermatology

## 2022-02-16 NOTE — Progress Notes (Signed)
   Follow-Up Visit   Subjective  Latoya Colon is a 56 y.o. female who presents for the following: Annual Exam (Patient here today for skin check, no concerns. Personal history of atypical moles. Family history of atypical moles, and non mole skin cancer. ).  General skin examination Location:  Duration:  Quality:  Associated Signs/Symptoms: Modifying Factors:  Severity:  Timing: Context:   Objective  Well appearing patient in no apparent distress; mood and affect are within normal limits. General skin examination: No atypical pigmented lesions (all checked with dermoscopy), no nonmelanoma skin cancer  Head - Anterior (Face), Right Forearm - Anterior Gritty pink 3 mm crusts  Mid Back Brown flattopped textured 5 mm papule  Chest - Medial (Center) Several 1 mm smooth red dermal papules  Left Axilla, Right Axilla 2 mm flesh-colored pedunculated papules    A full examination was performed including scalp, head, eyes, ears, nose, lips, neck, chest, axillae, abdomen, back, buttocks, bilateral upper extremities, bilateral lower extremities, hands, feet, fingers, toes, fingernails, and toenails. All findings within normal limits unless otherwise noted below.  Areas beneath undergarments not fully examined   Assessment & Plan    AK (actinic keratosis) (2) Head - Anterior (Face); Right Forearm - Anterior  Can schedule red light PDT in the fall.  Destruction of lesion - Right Forearm - Anterior Complexity: simple   Destruction method: cryotherapy   Informed consent: discussed and consent obtained   Timeout:  patient name, date of birth, surgical site, and procedure verified Lesion destroyed using liquid nitrogen: Yes   Cryotherapy cycles:  5 Outcome: patient tolerated procedure well with no complications   Post-procedure details: wound care instructions given    Seborrheic keratosis Mid Back  Leave if stable.  Screening for malignant neoplasm of skin  Annual skin  examination, encouraged to self examine twice annually.  Continue ultraviolet protection.  Hemangioma of skin Chest - Medial [place] Day)  No intervention necessary  Skin tag (2) Left Axilla; Right Axilla  May choose to remove in future      I, Lavonna Monarch, MD, have reviewed all documentation for this visit.  The documentation on 02/16/22 for the exam, diagnosis, procedures, and orders are all accurate and complete.

## 2022-02-17 ENCOUNTER — Encounter: Payer: Self-pay | Admitting: Dermatology

## 2024-03-18 DIAGNOSIS — L570 Actinic keratosis: Secondary | ICD-10-CM | POA: Diagnosis not present

## 2024-03-18 DIAGNOSIS — D485 Neoplasm of uncertain behavior of skin: Secondary | ICD-10-CM | POA: Diagnosis not present

## 2024-03-18 DIAGNOSIS — D2262 Melanocytic nevi of left upper limb, including shoulder: Secondary | ICD-10-CM | POA: Diagnosis not present

## 2024-03-18 DIAGNOSIS — L814 Other melanin hyperpigmentation: Secondary | ICD-10-CM | POA: Diagnosis not present

## 2024-03-18 DIAGNOSIS — D2272 Melanocytic nevi of left lower limb, including hip: Secondary | ICD-10-CM | POA: Diagnosis not present

## 2024-03-18 DIAGNOSIS — D224 Melanocytic nevi of scalp and neck: Secondary | ICD-10-CM | POA: Diagnosis not present

## 2024-03-18 DIAGNOSIS — D2271 Melanocytic nevi of right lower limb, including hip: Secondary | ICD-10-CM | POA: Diagnosis not present

## 2024-03-18 DIAGNOSIS — C44619 Basal cell carcinoma of skin of left upper limb, including shoulder: Secondary | ICD-10-CM | POA: Diagnosis not present

## 2024-03-18 DIAGNOSIS — L821 Other seborrheic keratosis: Secondary | ICD-10-CM | POA: Diagnosis not present

## 2024-03-18 DIAGNOSIS — D2261 Melanocytic nevi of right upper limb, including shoulder: Secondary | ICD-10-CM | POA: Diagnosis not present

## 2024-03-18 DIAGNOSIS — D225 Melanocytic nevi of trunk: Secondary | ICD-10-CM | POA: Diagnosis not present

## 2024-03-25 DIAGNOSIS — L570 Actinic keratosis: Secondary | ICD-10-CM | POA: Diagnosis not present

## 2024-03-25 DIAGNOSIS — D2239 Melanocytic nevi of other parts of face: Secondary | ICD-10-CM | POA: Diagnosis not present

## 2024-03-25 DIAGNOSIS — C44619 Basal cell carcinoma of skin of left upper limb, including shoulder: Secondary | ICD-10-CM | POA: Diagnosis not present
